# Patient Record
Sex: Male | Born: 1985 | Race: Black or African American | Hispanic: No | Marital: Married | Smoking: Current every day smoker
Health system: Southern US, Community
[De-identification: ages and names within clinical notes are randomized; demographics above are authoritative.]

## PROBLEM LIST (undated history)

## (undated) HISTORY — PX: OTHER SURGICAL HISTORY: SHX169

---
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
## 2007-04-14 ENCOUNTER — Emergency Department (HOSPITAL_COMMUNITY): Admission: EM | Admit: 2007-04-14 | Discharge: 2007-04-14 | Payer: Self-pay | Admitting: Emergency Medicine

## 2008-02-09 ENCOUNTER — Emergency Department (HOSPITAL_COMMUNITY): Admission: EM | Admit: 2008-02-09 | Discharge: 2008-02-09 | Payer: Self-pay | Admitting: Emergency Medicine

## 2008-02-12 ENCOUNTER — Emergency Department (HOSPITAL_COMMUNITY): Admission: EM | Admit: 2008-02-12 | Discharge: 2008-02-12 | Payer: Self-pay | Admitting: Emergency Medicine

## 2009-07-04 ENCOUNTER — Emergency Department (HOSPITAL_COMMUNITY): Admission: EM | Admit: 2009-07-04 | Discharge: 2009-07-04 | Payer: Self-pay | Admitting: Emergency Medicine

## 2009-07-08 ENCOUNTER — Ambulatory Visit (HOSPITAL_COMMUNITY): Admission: RE | Admit: 2009-07-08 | Discharge: 2009-07-08 | Payer: Self-pay | Admitting: Otolaryngology

## 2009-07-10 ENCOUNTER — Emergency Department (HOSPITAL_COMMUNITY): Admission: EM | Admit: 2009-07-10 | Discharge: 2009-07-10 | Payer: Self-pay | Admitting: Emergency Medicine

## 2009-08-04 ENCOUNTER — Emergency Department (HOSPITAL_COMMUNITY): Admission: EM | Admit: 2009-08-04 | Discharge: 2009-08-04 | Payer: Self-pay | Admitting: Emergency Medicine

## 2009-10-04 ENCOUNTER — Emergency Department (HOSPITAL_COMMUNITY): Admission: EM | Admit: 2009-10-04 | Discharge: 2009-10-04 | Payer: Self-pay | Admitting: Family Medicine

## 2010-12-05 LAB — GC/CHLAMYDIA PROBE AMP, GENITAL: GC Probe Amp, Genital: NEGATIVE

## 2010-12-22 LAB — CARBOXYHEMOGLOBIN: Methemoglobin: 1 % (ref 0.0–1.5)

## 2010-12-23 LAB — CBC
MCHC: 34.5 g/dL (ref 30.0–36.0)
MCV: 96.8 fL (ref 78.0–100.0)
Platelets: 257 10*3/uL (ref 150–400)
RBC: 4.1 MIL/uL — ABNORMAL LOW (ref 4.22–5.81)
WBC: 9.9 10*3/uL (ref 4.0–10.5)

## 2011-02-01 NOTE — H&P (Signed)
NAME:  Dustin Juarez, Dustin Juarez               ACCOUNT NO.:  [id number]   MEDICAL RECORD NO.:  [id number]          PATIENT TYPE:  EMS   LOCATION:                               FACILITY:  [facility]   PHYSICIAN:  Michaelyn Barter, M.D. DATE OF BIRTH:  20-Dec-1985   DATE OF ADMISSION:  02/12/2008  DATE OF DISCHARGE:                              HISTORY & PHYSICAL   PRIMARY CARE PHYSICIAN:  Unassigned.   CHIEF COMPLAINTS:  Drug overdose.   HISTORY OF PRESENT ILLNESS:  Mr. Dustin Juarez is a 25 year old gentleman who  indicates that he took at least 25 Tylenol at approximately 11 o'clock  a.m.  He stated that he was feeling very depressed and stressed out and  indicated that he took the Tylenol because he wanted to try to take  things off of his mind. He then went on to state that he does not want  to go into details regarding his current issues and was very vague with  regards to giving any additional history regarding the drug overdose.  He stated that he did not necessarily want to kill himself; however, he  just wanted to clear his mind of his current situation.  He denies  currently having any nausea, vomiting or abdominal pain.  He has no  current complaints and actually indicates that he feels back to  baseline.   PAST MEDICAL HISTORY:  Significant for no illnesses.   PAST SURGICAL HISTORY:  He denies any surgeries.   ALLERGIES:  None.   HOME MEDICATIONS:  None.   SOCIAL HISTORY:  Cigarettes:  The patient started smoking at the age of  71.  He smokes just under 1 pack per day.  Alcohol:  He drinks  occasionally.  Cocaine:  Never. Marijuana:  Occasionally.   FAMILY HISTORY:  Mother lives in [place], and he is not really sure of  her medical history.  Father died from cirrhosis of the liver secondary  to history of alcohol abuse.   REVIEW OF SYSTEMS:  As per HPI.   PHYSICAL EXAMINATION:  GENERAL:  The patient is awake.  He is  cooperative with regards to the physical exam. He is in no obvious  respiratory distress.  VITAL SIGNS:  His blood pressure is 122/83, heart rate 58, respirations  15.  O2 saturation 98% on room air.  HEENT:  Normocephalic, atraumatic.  Anicteric. Extraocular movements are  intact.  Pupils react equally to light bilaterally.  Oral mucosa is  pink.  No thrush, no exudates.  NECK:  Supple.  Shotty anterior and posterior cervical lymphadenopathy  can be appreciated bilaterally, more so on the right than on the left.  No thyromegaly.  CARDIAC:  S1-S2 present.  Regular rate and rhythm.  No murmurs, no  gallops, no rubs.  RESPIRATORY:  No crackles or wheezes.  ABDOMEN:  Flat, soft, nontender, nondistended.  No masses palpated.  No  hepatosplenomegaly appreciated.  EXTREMITIES:  No distal leg edema.  NEUROLOGIC:  The patient is alert and oriented to person, place and  time.  MUSCULOSKELETAL:  5/5 upper and lower extremity strength.   LABORATORY DATA:  Urinalysis is negative. Drug screen was positive for  opiates and for tetrahydrocannabinol. Sodium is 138, potassium 3.9,  chloride 107, CO2 26, glucose 102, BUN 7, creatinine 0.9, bilirubin  total 1.0, alkaline phosphatase 56, SGOT 23, SGPT 16, total protein 6.7,  albumin 3.9, calcium 9.3.  Acetaminophen level is 14.3. WBC 6.9,  hemoglobin 14.3, hematocrit 41, platelet count 261   ASSESSMENT AND PLAN:  1. Intentional drug overdose.  The patient denies wanting to kill      myself; however, he indicates that he was very depressed and      stressed out and did state that he was trying to clear his mind. He      is very vague with regards to the details of what is going on to      prompt his current desire to commit a drug overdose. We will      provide a one-to-one sitter. We will consult psychiatry.  I have      had a very lengthy discussion with the patient.  The patient      actually indicates that he does not want to stay in the hospital      for 24 hours if that is the length of time that it may take for  him      to be interviewed by one of the psychiatrist. He began to remove      his telemetry monitoring and stated that he would like to be seen      by someone sooner as opposed to later, stating that he refuses to      be admitted into the hospital.  I have contacted the [facility] staff, and      they have indicated that they will try to see the patient later      this afternoon. If, however, they are unable to see the patient,      then I will go ahead and admit the patient  2. Tylenol overdose.  Will start the patient on N-acetyl cysteine      treatment.  3. Gastrointestinal prophylaxis.  Protonix.  4. Depression.     Addendum:  An [facility] staff member did evaluate the patient, and she  discharged him home.      Michaelyn Barter, M.D.  Electronically Signed     OR/MEDQ  D:  02/12/2008  T:  02/12/2008  Job:  [id number]

## 2011-06-15 LAB — CBC
MCHC: 35
Platelets: 261
RBC: 4.41

## 2011-06-15 LAB — URINALYSIS, ROUTINE W REFLEX MICROSCOPIC
Hgb urine dipstick: NEGATIVE
Ketones, ur: NEGATIVE
Nitrite: NEGATIVE
Urobilinogen, UA: 1
pH: 7

## 2011-06-15 LAB — RAPID URINE DRUG SCREEN, HOSP PERFORMED
Barbiturates: NOT DETECTED
Benzodiazepines: NOT DETECTED
Cocaine: NOT DETECTED
Opiates: POSITIVE — AB
Tetrahydrocannabinol: POSITIVE — AB

## 2011-06-15 LAB — URINE MICROSCOPIC-ADD ON

## 2011-06-15 LAB — DIFFERENTIAL
Basophils Absolute: 0
Lymphs Abs: 1.6
Monocytes Relative: 7
Neutro Abs: 4.7

## 2011-06-15 LAB — COMPREHENSIVE METABOLIC PANEL
Albumin: 3.9
BUN: 7
CO2: 26
Glucose, Bld: 102 — ABNORMAL HIGH
Total Protein: 6.7

## 2011-06-15 LAB — ACETAMINOPHEN LEVEL: Acetaminophen (Tylenol), Serum: <10 — ABNORMAL LOW

## 2011-07-04 LAB — RAPID URINE DRUG SCREEN, HOSP PERFORMED
Amphetamines: NOT DETECTED
Barbiturates: NOT DETECTED
Benzodiazepines: NOT DETECTED
Cocaine: NOT DETECTED
Opiates: NOT DETECTED
Tetrahydrocannabinol: POSITIVE — AB

## 2011-07-04 LAB — URINALYSIS, ROUTINE W REFLEX MICROSCOPIC
Bilirubin Urine: NEGATIVE
Glucose, UA: NEGATIVE
Hgb urine dipstick: NEGATIVE
Ketones, ur: 15 — AB
Nitrite: NEGATIVE
Protein, ur: NEGATIVE
Specific Gravity, Urine: 1.011
Urobilinogen, UA: 0.2
pH: 7.5

## 2011-07-04 LAB — CBC
HCT: 43.5
Hemoglobin: 14.8
MCHC: 34
MCV: 95.6
Platelets: 297
RBC: 4.55
RDW: 12
WBC: 8.9

## 2011-07-04 LAB — COMPREHENSIVE METABOLIC PANEL
Alkaline Phosphatase: 57
CO2: 19
Chloride: 110
Glucose, Bld: 84
Potassium: 3.6

## 2011-07-04 LAB — DIFFERENTIAL: Monocytes Absolute: 0.3

## 2011-07-04 LAB — AMYLASE: Amylase: 57

## 2011-07-04 LAB — LIPASE, BLOOD: Lipase: 13

## 2014-03-09 ENCOUNTER — Emergency Department (HOSPITAL_COMMUNITY): Payer: Self-pay

## 2014-03-09 ENCOUNTER — Emergency Department (HOSPITAL_COMMUNITY)
Admission: EM | Admit: 2014-03-09 | Discharge: 2014-03-09 | Disposition: A | Discharge status: Home / Self Care | Payer: Self-pay | Attending: Emergency Medicine | Admitting: Emergency Medicine

## 2014-03-09 ENCOUNTER — Encounter (HOSPITAL_COMMUNITY): Payer: Self-pay | Admitting: Emergency Medicine

## 2014-03-09 DIAGNOSIS — R091 Pleurisy: Secondary | ICD-10-CM | POA: Insufficient documentation

## 2014-03-09 DIAGNOSIS — J40 Bronchitis, not specified as acute or chronic: Secondary | ICD-10-CM | POA: Insufficient documentation

## 2014-03-09 DIAGNOSIS — J9801 Acute bronchospasm: Secondary | ICD-10-CM | POA: Insufficient documentation

## 2014-03-09 DIAGNOSIS — F17200 Nicotine dependence, unspecified, uncomplicated: Secondary | ICD-10-CM | POA: Insufficient documentation

## 2014-03-09 DIAGNOSIS — J209 Acute bronchitis, unspecified: Secondary | ICD-10-CM

## 2014-03-09 LAB — CBC WITH DIFFERENTIAL/PLATELET
Basophils Absolute: 0 10*3/uL (ref 0.0–0.1)
Basophils Relative: 0 % (ref 0–1)
EOS ABS: 0.1 10*3/uL (ref 0.0–0.7)
EOS PCT: 1 % (ref 0–5)
HCT: 42.4 % (ref 39.0–52.0)
HEMOGLOBIN: 14.7 g/dL (ref 13.0–17.0)
Lymphocytes Relative: 27 % (ref 12–46)
Lymphs Abs: 2.5 10*3/uL (ref 0.7–4.0)
MCH: 33.1 pg (ref 26.0–34.0)
MCHC: 34.7 g/dL (ref 30.0–36.0)
MCV: 95.5 fL (ref 78.0–100.0)
MONO ABS: 0.5 10*3/uL (ref 0.1–1.0)
Monocytes Relative: 5 % (ref 3–12)
Neutro Abs: 6.1 10*3/uL (ref 1.7–7.7)
Neutrophils Relative %: 67 % (ref 43–77)
Platelets: 278 10*3/uL (ref 150–400)
RBC: 4.44 MIL/uL (ref 4.22–5.81)
RDW: 12.4 % (ref 11.5–15.5)
WBC: 9.1 10*3/uL (ref 4.0–10.5)

## 2014-03-09 LAB — COMPREHENSIVE METABOLIC PANEL
ALT: 14 U/L (ref 0–53)
AST: 20 U/L (ref 0–37)
Albumin: 4.1 g/dL (ref 3.5–5.2)
Alkaline Phosphatase: 42 U/L (ref 39–117)
BUN: 7 mg/dL (ref 6–23)
CO2: 22 meq/L (ref 19–32)
Calcium: 9.6 mg/dL (ref 8.4–10.5)
Chloride: 107 mEq/L (ref 96–112)
Creatinine, Ser: 0.84 mg/dL (ref 0.50–1.35)
GFR calc non Af Amer: >90 mL/min (ref >90)
Glucose, Bld: 82 mg/dL (ref 70–99)
POTASSIUM: 4 meq/L (ref 3.7–5.3)
SODIUM: 143 meq/L (ref 137–147)
TOTAL PROTEIN: 7.4 g/dL (ref 6.0–8.3)
Total Bilirubin: 1 mg/dL (ref 0.3–1.2)

## 2014-03-09 LAB — I-STAT TROPONIN, ED: Troponin i, poc: 0.01 ng/mL (ref 0.00–0.08)

## 2014-03-09 LAB — D-DIMER, QUANTITATIVE: D-Dimer, Quant: <0.27 ug/mL-FEU (ref 0.00–0.48)

## 2014-03-09 MED ORDER — ALBUTEROL SULFATE (2.5 MG/3ML) 0.083% IN NEBU
5.0000 mg | INHALATION_SOLUTION | Freq: Once | RESPIRATORY_TRACT | Status: AC
  Administered 2014-03-09: 5 mg via RESPIRATORY_TRACT
  Filled 2014-03-09: qty 6

## 2014-03-09 MED ORDER — PREDNISONE 20 MG PO TABS
60.0000 mg | ORAL_TABLET | Freq: Once | ORAL | Status: AC
  Administered 2014-03-09: 60 mg via ORAL
  Filled 2014-03-09: qty 3

## 2014-03-09 MED ORDER — ALBUTEROL SULFATE HFA 108 (90 BASE) MCG/ACT IN AERS
2.0000 | INHALATION_SPRAY | RESPIRATORY_TRACT | Status: DC | PRN
  Administered 2014-03-09: 2 via RESPIRATORY_TRACT
  Filled 2014-03-09 (×2): qty 6.7

## 2014-03-09 MED ORDER — ALBUTEROL SULFATE HFA 108 (90 BASE) MCG/ACT IN AERS: 2.0000 | INHALATION_SPRAY | Freq: Once | RESPIRATORY_TRACT | Status: DC

## 2014-03-09 NOTE — ED Notes (Signed)
PT lost inhaler between room and discharge. [facility] -PA stated he could have a 2nd inhaler.

## 2014-03-09 NOTE — ED Notes (Signed)
PT states pain 10/10 sharp stabbing w/ inspiration on L side of lung and pressure on R lung w/ inspiration. PT denies fever, cough, congestion, excessive exercise, etc.

## 2014-03-09 NOTE — ED Notes (Signed)
Pt presents to department for evaluation of chest tightness and SOB. Onset this morning. Tightness increases with deep breathing. Respirations unlabored. Pt is alert and oriented x4.

## 2014-03-09 NOTE — Discharge Instructions (Signed)
Acute Bronchitis Bronchitis is when the airways that extend from the windpipe into the lungs get red, puffy, and painful (inflamed). Bronchitis often causes thick spit (mucus) to develop. This leads to a cough. A cough is the most common symptom of bronchitis. In acute bronchitis, the condition usually begins suddenly and goes away over time (usually in 2 weeks). Smoking, allergies, and asthma can make bronchitis worse. Repeated episodes of bronchitis may cause more lung problems. HOME CARE  Rest.  Drink enough fluids to keep your pee (urine) clear or pale yellow (unless you need to limit fluids as told by your doctor).  Only take over-the-counter or prescription medicines as told by your doctor.  Avoid smoking and secondhand smoke. These can make bronchitis worse. If you are a smoker, think about using nicotine gum or skin patches. Quitting smoking will help your lungs heal faster.  Reduce the chance of getting bronchitis again by:  Washing your hands often.  Avoiding people with cold symptoms.  Trying not to touch your hands to your mouth, nose, or eyes.  Follow up with your doctor as told. GET HELP IF: Your symptoms do not improve after 1 week of treatment. Symptoms include:  Cough.  Fever.  Coughing up thick spit.  Body aches.  Chest congestion.  Chills.  Shortness of breath.  Sore throat. GET HELP RIGHT AWAY IF:   You have an increased fever.  You have chills.  You have severe shortness of breath.  You have bloody thick spit (sputum).  You throw up (vomit) often.  You lose too much body fluid (dehydration).  You have a severe headache.  You faint. MAKE SURE YOU:   Understand these instructions.  Will watch your condition.  Will get help right away if you are not doing well or get worse. Document Released: 02/22/2008 Document Revised: 05/08/2013 Document Reviewed: 02/26/2013 Exit[employer] Patient Information 2015 [employer]. This information is not  intended to replace advice given to you by your health care provider. Make sure you discuss any questions you have with your health care provider.  

## 2014-03-09 NOTE — ED Notes (Signed)
MD at bedside. 

## 2014-03-09 NOTE — ED Provider Notes (Signed)
CSN: [id number]     Arrival date & time 03/09/14  1423 History   First MD Initiated Contact with Patient 03/09/14 1538     Chief Complaint  Patient presents with  . Chest Pain  . Shortness of Breath     (Consider location/radiation/quality/duration/timing/severity/associated sxs/prior Treatment) HPI 28 year old male comes in today complaining of bilateral lower lateral chest pain with deep breathing. He states that it is painful but has not really correlate with shortness of breath. He has not noted any cough, fever, or chills. He is a smoker.  History reviewed. No pertinent past medical history. History reviewed. No pertinent past surgical history. No family history on file. History  Substance Use Topics  . Smoking status: Current Every Day Smoker  . Smokeless tobacco: Not on file  . Alcohol Use: Yes    Review of Systems  All other systems reviewed and are negative.     Allergies  Review of patient's allergies indicates no known allergies.  Home Medications   Prior to Admission medications   Not on File   BP 129/89  Pulse 50  Temp(Src) 98 F (36.7 C) (Oral)  Resp 19  Ht 6\' 2"  (1.88 m)  Wt 177 lb (80.287 kg)  BMI 22.72 kg/m2  SpO2 100% Physical Exam  Nursing note and vitals reviewed. Constitutional: He is oriented to person, place, and time. He appears well-developed and well-nourished.  HENT:  Head: Normocephalic and atraumatic.  Right Ear: External ear normal.  Left Ear: External ear normal.  Nose: Nose normal.  Mouth/Throat: Oropharynx is clear and moist.  Eyes: Conjunctivae and EOM are normal. Pupils are equal, round, and reactive to light.  Neck: Normal range of motion. Neck supple.  Cardiovascular: Normal rate, regular rhythm, normal heart sounds and intact distal pulses.   Pulmonary/Chest: Effort normal. No respiratory distress. He has no wheezes. He exhibits no tenderness.  No wheezing or rales the breath sounds are to use sleep decreased   Abdominal: Soft. Bowel sounds are normal. He exhibits no distension and no mass. There is no tenderness. There is no guarding.  Musculoskeletal: Normal range of motion.  Neurological: He is alert and oriented to person, place, and time. He has normal reflexes. He exhibits normal muscle tone. Coordination normal.  Skin: Skin is warm and dry.  Psychiatric: He has a normal mood and affect. His behavior is normal. Judgment and thought content normal.    ED Course  Procedures (including critical care time) Labs Review Labs Reviewed  CBC WITH DIFFERENTIAL  COMPREHENSIVE METABOLIC PANEL  D-DIMER, QUANTITATIVE  I-STAT TROPOININ, ED    Imaging Review Dg Chest 2 View  03/09/2014   CLINICAL DATA:  CHEST PAIN SHORTNESS OF BREATH  EXAM: CHEST  2 VIEW  COMPARISON:  Two-view chest 08/04/2009  FINDINGS: The heart size and mediastinal contours are within normal limits. Both lungs are clear. The visualized skeletal structures are unremarkable.  IMPRESSION: No active cardiopulmonary disease.   Electronically Signed   By: Salome HolmesHector  Cooper M.D.   On: 03/09/2014 14:59     EKG Interpretation   Date/Time:  Sunday March 09 2014 14:29:45 EDT Ventricular Rate:  53 PR Interval:  194 QRS Duration: 90 QT Interval:  376 QTC Calculation: 352 R Axis:   87 Text Interpretation:  Sinus bradycardia Otherwise normal ECG Confirmed by  Wendal Wilkie MD, Duwayne HeckANIELLE ([id number]) on 03/09/2014 6:05:03 PM      MDM   Final diagnoses:  Bronchitis with bronchospasm  Pleurisy    Patient with symptoms relieved  with nebulizer and with increased air movement.     Hilario Quarryanielle S Greer Koeppen, MD 03/14/14 1026

## 2014-07-17 ENCOUNTER — Emergency Department (HOSPITAL_COMMUNITY)
Admission: EM | Admit: 2014-07-17 | Discharge: 2014-07-17 | Discharge status: Against Medical Advice | Payer: Self-pay | Attending: Emergency Medicine | Admitting: Emergency Medicine

## 2014-07-17 ENCOUNTER — Encounter (HOSPITAL_COMMUNITY): Payer: Self-pay | Admitting: Emergency Medicine

## 2014-07-17 DIAGNOSIS — Z720 Tobacco use: Secondary | ICD-10-CM | POA: Insufficient documentation

## 2014-07-17 DIAGNOSIS — Z5329 Procedure and treatment not carried out because of patient's decision for other reasons: Secondary | ICD-10-CM | POA: Insufficient documentation

## 2014-07-17 NOTE — ED Notes (Signed)
Patient states that he was seen at an Urgent care [address] last week and they filled out a paper for him.   He further states that he didn't have to pay or been seen by a physician, that they "just filled out the paper, you can see I'm fine".   Patient states lost the paper and went back to same urgent care this morning and urgent care advised that it would be $30.00 for a visit and he would have to been seen this time.   Patient advised he didn't have money to be seen and decided to come here "because I don't have to pay y'all".

## 2014-07-17 NOTE — ED Notes (Signed)
Patient states has been out of work since last week for nausea.   Patient states that he had went to urgent care and had the same paper filled out by them, but then lost the paper.  Patient states doesn't have funds to go back to urgent care and get filled out again.   Patient states he has to have filled out before he can return to work.   Patient denies any problems right now.   States he is fine to go back to work.

## 2014-07-17 NOTE — ED Notes (Signed)
Patient states he didn't want to stay "all this time for a physical.   I mean, I ain't gone waste no more time here".  Patient left without signing.

## 2014-07-17 NOTE — ED Notes (Signed)
Pt sts he needs to be cleared and have from filled out for work.

## 2014-07-22 NOTE — ED Provider Notes (Signed)
Patient left before provider evaluation. Please refer to nursing notes for further information.  Arthor CaptainAbigail Beza Steppe, PA-C 07/22/14 1803  Arthor CaptainAbigail Alexei Ey, PA-C 07/22/14 1803  Flint MelterElliott L Wentz, MD 10/06/14 [phone]

## 2014-09-01 ENCOUNTER — Emergency Department (HOSPITAL_COMMUNITY)
Admission: EM | Admit: 2014-09-01 | Discharge: 2014-09-01 | Disposition: A | Discharge status: Home / Self Care | Payer: Self-pay | Attending: Emergency Medicine | Admitting: Emergency Medicine

## 2014-09-01 ENCOUNTER — Encounter (HOSPITAL_COMMUNITY): Payer: Self-pay | Admitting: Emergency Medicine

## 2014-09-01 DIAGNOSIS — Z720 Tobacco use: Secondary | ICD-10-CM | POA: Insufficient documentation

## 2014-09-01 DIAGNOSIS — J0100 Acute maxillary sinusitis, unspecified: Secondary | ICD-10-CM | POA: Insufficient documentation

## 2014-09-01 MED ORDER — IBUPROFEN 800 MG PO TABS: 800.0000 mg | ORAL_TABLET | Freq: Three times a day (TID) | ORAL | Status: AC

## 2014-09-01 MED ORDER — AMOXICILLIN 500 MG PO CAPS: 500.0000 mg | ORAL_CAPSULE | Freq: Three times a day (TID) | ORAL | Status: AC

## 2014-09-01 MED ORDER — TRAMADOL HCL 50 MG PO TABS
50.0000 mg | ORAL_TABLET | Freq: Once | ORAL | Status: AC
  Administered 2014-09-01: 50 mg via ORAL
  Filled 2014-09-01: qty 1

## 2014-09-01 MED ORDER — TRAMADOL HCL 50 MG PO TABS: 50.0000 mg | ORAL_TABLET | Freq: Four times a day (QID) | ORAL | Status: AC | PRN

## 2014-09-01 MED ORDER — AMOXICILLIN 500 MG PO CAPS: 1000.0000 mg | ORAL_CAPSULE | Freq: Two times a day (BID) | ORAL | Status: AC

## 2014-09-01 NOTE — ED Notes (Signed)
Patient c/o R sided face pain x2 days, pain 10/10, sharp, throbbing, c/o R eye blurriness.

## 2014-09-01 NOTE — ED Provider Notes (Signed)
CSN: [id number]     Arrival date & time 09/01/14  [id number] History  This chart was scribed for Dustin GladHeather Laurajean Hosek, PA-C, working with Audree CamelScott T Goldston, MD found by Elon SpannerGarrett Cook, ED Scribe. This patient was seen in room WTR5/WTR5 and the patient's care was started at 7:42 PM.   No chief complaint on file.  The history is provided by the patient. No language interpreter was used.   HPI Comments: Dustin Rampimothy Friedland is a 28 y.o. male who presents to the Emergency Department complaining of constant dental pain concentrated on the roof of his mouth onset 2 days ago.  Patient states that "it feels like an abscess".  The pain is worsened by eating, touch.  He reports the pain radiates to the right side of his face and right side of his head.  He reports it "feels swollen" but does not actually believe it is swollen.  Patient has taken ibuprofen/Tylenol without relief.  He reports difficulty sleeping due to pain.  Patient denies fever, nausea, vomiting, nuchal rigidity, nasal congestion.    No past medical history on file. No past surgical history on file. No family history on file. History  Substance Use Topics  . Smoking status: Current Every Day Smoker  . Smokeless tobacco: Not on file  . Alcohol Use: Yes    Review of Systems  Constitutional: Negative for fever.  HENT: Positive for dental problem. Negative for congestion.   Gastrointestinal: Negative for nausea and vomiting.  Neurological: Positive for headaches.  All other systems reviewed and are negative.     Allergies  Review of patient's allergies indicates no known allergies.  Home Medications   Prior to Admission medications   Not on File   BP 153/82 mmHg  Pulse 71  Temp(Src) 98.5 F (36.9 C) (Oral)  Resp 16  Ht 6\' 2"  (1.88 m)  Wt 185 lb (83.915 kg)  BMI 23.74 kg/m2  SpO2 96% Physical Exam  Constitutional: He is oriented to person, place, and time. He appears well-developed and well-nourished. No distress.  HENT:  Head:  Normocephalic and atraumatic.  Mouth/Throat: Oropharynx is clear and moist. No trismus in the jaw. No dental abscesses.  Visualization of right and left TM partially obscured due to cerumen.  Visualized portion of TM without erythema.  No sublingual tenderness or swelling.  TTP of right maxillary sinus.  No erythema or edema or lesions visualized.    Eyes: Conjunctivae and EOM are normal.  Neck: Neck supple. No tracheal deviation present.  Cardiovascular: Normal rate.   Pulmonary/Chest: Effort normal. No respiratory distress.  Musculoskeletal: Normal range of motion.  Neurological: He is alert and oriented to person, place, and time.  Skin: Skin is warm and dry.  Psychiatric: He has a normal mood and affect. His behavior is normal.  Nursing note and vitals reviewed.   ED Course  Procedures (including critical care time)  DIAGNOSTIC STUDIES: Oxygen Saturation is 96% on RA, adequate by my interpretation.    COORDINATION OF CARE:  7:47 PM Will prescribe antibiotic.  Patient acknowledges and agrees with plan.    Labs Review Labs Reviewed - No data to display  Imaging Review No results found.   EKG Interpretation None      MDM   Final diagnoses:  None   Patient presenting with sinus pain.  Non toxic appearing and afebrile.  Given Rx for antibiotic.  Stable for discharge.  Return precautions given.    I personally performed the services described in this documentation, which  was scribed in my presence. The recorded information has been reviewed and is accurate.    Dustin GladHeather Ennis Heavner, PA-C 09/04/14 2118  Audree CamelScott T Goldston, MD 09/08/14 [phone]

## 2014-09-05 ENCOUNTER — Encounter (HOSPITAL_COMMUNITY): Payer: Self-pay | Admitting: *Deleted

## 2014-09-05 ENCOUNTER — Emergency Department (HOSPITAL_COMMUNITY)
Admission: EM | Admit: 2014-09-05 | Discharge: 2014-09-05 | Disposition: A | Discharge status: Home / Self Care | Payer: Self-pay | Attending: Emergency Medicine | Admitting: Emergency Medicine

## 2014-09-05 DIAGNOSIS — Z720 Tobacco use: Secondary | ICD-10-CM | POA: Insufficient documentation

## 2014-09-05 DIAGNOSIS — R202 Paresthesia of skin: Secondary | ICD-10-CM | POA: Insufficient documentation

## 2014-09-05 DIAGNOSIS — F1210 Cannabis abuse, uncomplicated: Secondary | ICD-10-CM | POA: Insufficient documentation

## 2014-09-05 DIAGNOSIS — M791 Myalgia: Secondary | ICD-10-CM | POA: Insufficient documentation

## 2014-09-05 DIAGNOSIS — R197 Diarrhea, unspecified: Secondary | ICD-10-CM | POA: Insufficient documentation

## 2014-09-05 DIAGNOSIS — E876 Hypokalemia: Secondary | ICD-10-CM | POA: Insufficient documentation

## 2014-09-05 DIAGNOSIS — Z791 Long term (current) use of non-steroidal anti-inflammatories (NSAID): Secondary | ICD-10-CM | POA: Insufficient documentation

## 2014-09-05 DIAGNOSIS — R1110 Vomiting, unspecified: Secondary | ICD-10-CM | POA: Insufficient documentation

## 2014-09-05 DIAGNOSIS — Z792 Long term (current) use of antibiotics: Secondary | ICD-10-CM | POA: Insufficient documentation

## 2014-09-05 LAB — CBC WITH DIFFERENTIAL/PLATELET
Basophils Absolute: 0 10*3/uL (ref 0.0–0.1)
Basophils Relative: 0 % (ref 0–1)
EOS ABS: 0.1 10*3/uL (ref 0.0–0.7)
EOS PCT: 0 % (ref 0–5)
HCT: 44 % (ref 39.0–52.0)
HEMOGLOBIN: 14.9 g/dL (ref 13.0–17.0)
LYMPHS ABS: 0.5 10*3/uL — AB (ref 0.7–4.0)
Lymphocytes Relative: 3 % — ABNORMAL LOW (ref 12–46)
MCH: 32.5 pg (ref 26.0–34.0)
MCHC: 33.9 g/dL (ref 30.0–36.0)
MCV: 95.9 fL (ref 78.0–100.0)
MONO ABS: 0.5 10*3/uL (ref 0.1–1.0)
MONOS PCT: 3 % (ref 3–12)
Neutro Abs: 13.7 10*3/uL — ABNORMAL HIGH (ref 1.7–7.7)
Neutrophils Relative %: 94 % — ABNORMAL HIGH (ref 43–77)
PLATELETS: 281 10*3/uL (ref 150–400)
RBC: 4.59 MIL/uL (ref 4.22–5.81)
RDW: 11.9 % (ref 11.5–15.5)
WBC: 14.7 10*3/uL — ABNORMAL HIGH (ref 4.0–10.5)

## 2014-09-05 LAB — BASIC METABOLIC PANEL
Anion gap: 13 (ref 5–15)
BUN: 8 mg/dL (ref 6–23)
CHLORIDE: 104 meq/L (ref 96–112)
CO2: 20 mEq/L (ref 19–32)
Calcium: 9.2 mg/dL (ref 8.4–10.5)
Creatinine, Ser: 0.93 mg/dL (ref 0.50–1.35)
GFR calc non Af Amer: >90 mL/min (ref >90)
Glucose, Bld: 102 mg/dL — ABNORMAL HIGH (ref 70–99)
Potassium: 3.4 mEq/L — ABNORMAL LOW (ref 3.7–5.3)
Sodium: 137 mEq/L (ref 137–147)

## 2014-09-05 LAB — CK: Total CK: 270 U/L — ABNORMAL HIGH (ref 7–232)

## 2014-09-05 MED ORDER — POTASSIUM CHLORIDE CRYS ER 20 MEQ PO TBCR
40.0000 meq | EXTENDED_RELEASE_TABLET | Freq: Once | ORAL | Status: AC
  Administered 2014-09-05: 40 meq via ORAL
  Filled 2014-09-05: qty 2

## 2014-09-05 MED ORDER — ONDANSETRON 8 MG PO TBDP
8.0000 mg | ORAL_TABLET | Freq: Once | ORAL | Status: AC
  Administered 2014-09-05: 8 mg via ORAL
  Filled 2014-09-05: qty 1

## 2014-09-05 MED ORDER — ONDANSETRON HCL 8 MG PO TABS: 8.0000 mg | ORAL_TABLET | Freq: Three times a day (TID) | ORAL | Status: AC | PRN

## 2014-09-05 NOTE — ED Provider Notes (Signed)
CSN: [id number]     Arrival date & time 09/05/14  1208 History   First MD Initiated Contact with Patient 09/05/14 1312     Chief Complaint  Patient presents with  . body soreness   . hand numbness       Patient is a 28 y.o. male presenting with vomiting. The history is provided by the patient.  Emesis Severity:  Moderate Duration: several hours. Timing:  Intermittent Progression:  Worsening Chronicity:  New Relieved by:  None tried Worsened by:  Nothing tried Associated symptoms: diarrhea and myalgias   Patient reports after waking up this morning he smoked marijuana He went to work and was sent home as he was not needed He reports that soon after he had episodes of nonbloody vomiting and one episode of nonbloody diarrea No cp/sob/abd pain He reports myalgias He also reports numbness in his hands/arms.  No weakness is reported.    PMH - none Soc hx - marijuana abuse Past Surgical History  Procedure Laterality Date  . Plate in left sinus cavity     History reviewed. No pertinent family history. History  Substance Use Topics  . Smoking status: Current Every Day Smoker -- 0.50 packs/day for 10 years    Types: Cigarettes  . Smokeless tobacco: Not on file  . Alcohol Use: Yes    Review of Systems  Constitutional: Negative for fever.  Cardiovascular: Negative for chest pain.  Gastrointestinal: Positive for vomiting and diarrhea.  Musculoskeletal: Positive for myalgias.  Neurological: Positive for numbness. Negative for weakness.  All other systems reviewed and are negative.     Allergies  Review of patient's allergies indicates no known allergies.  Home Medications   Prior to Admission medications   Medication Sig Start Date End Date Taking? Authorizing Provider  amoxicillin (AMOXIL) 500 MG capsule Take 2 capsules (1,000 mg total) by mouth 2 (two) times daily. 09/01/14   Heather Laisure, PA-C  amoxicillin (AMOXIL) 500 MG capsule Take 1 capsule (500 mg total) by  mouth 3 (three) times daily. 09/01/14   Heather Laisure, PA-C  ibuprofen (ADVIL,MOTRIN) 800 MG tablet Take 1 tablet (800 mg total) by mouth 3 (three) times daily. 09/01/14   Heather Laisure, PA-C  ibuprofen (ADVIL,MOTRIN) 800 MG tablet Take 1 tablet (800 mg total) by mouth 3 (three) times daily. 09/01/14   Heather Laisure, PA-C  ondansetron (ZOFRAN) 8 MG tablet Take 1 tablet (8 mg total) by mouth every 8 (eight) hours as needed for nausea. 09/05/14   Joya Gaskinsonald W Adrina Armijo, MD  traMADol (ULTRAM) 50 MG tablet Take 1 tablet (50 mg total) by mouth every 6 (six) hours as needed. 09/01/14   Heather Laisure, PA-C   BP 127/89 mmHg  Pulse 74  Temp(Src) 98.3 F (36.8 C) (Oral)  Resp 16  SpO2 99% Physical Exam CONSTITUTIONAL: Well developed/well nourished HEAD: Normocephalic/atraumatic EYES: EOMI/PERRL ENMT: Mucous membranes moist NECK: supple no meningeal signs SPINE/BACK:entire spine nontender CV: S1/S2 noted, no murmurs/rubs/gallops noted LUNGS: Lungs are clear to auscultation bilaterally, no apparent distress ABDOMEN: soft, nontender, no rebound or guarding, bowel sounds noted throughout abdomen GU:no cva tenderness NEURO: Pt is awake/alert/appropriate, moves all extremitiesx4.  No facial droop.   No arm/leg drift.  Reports numbness to bilateral forearms only but equal hand grips and equal power with 5/5 strength with shoulder abduction/elbow flex/extension and wrist flex/extension.   EXTREMITIES: pulses normal/equal x4 extremities, full ROM SKIN: warm, color normal PSYCH: no abnormalities of mood noted, alert and oriented to situation  ED Course  Procedures   No focal neuro deficits Suspect viral GE given vomiting/diarrhea He is well appearing, no distress, appropriate for d/c home Advised against marijuana abuse Labs Review Labs Reviewed  BASIC METABOLIC PANEL - Abnormal; Notable for the following:    Potassium 3.4 (*)    Glucose, Bld 102 (*)    All other components within normal  limits  CK - Abnormal; Notable for the following:    Total CK 270 (*)    All other components within normal limits  CBC WITH DIFFERENTIAL - Abnormal; Notable for the following:    WBC 14.7 (*)    Neutrophils Relative % 94 (*)    Neutro Abs 13.7 (*)    Lymphocytes Relative 3 (*)    Lymphs Abs 0.5 (*)    All other components within normal limits     Medications  ondansetron (ZOFRAN-ODT) disintegrating tablet 8 mg (8 mg Oral Given 09/05/14 1356)  potassium chloride SA (K-DUR,KLOR-CON) CR tablet 40 mEq (40 mEq Oral Given 09/05/14 1510)    MDM   Final diagnoses:  Vomiting and diarrhea  Marijuana abuse  Paresthesia  Paresthesia of hand, bilateral  Hypokalemia   Nursing notes including past medical history and social history reviewed and considered in documentation Labs/vital reviewed myself and considered during evaluation Previous records reviewed and considered      Joya Gaskinsonald W Delaney Schnick, MD 09/05/14 [phone]

## 2014-09-05 NOTE — ED Notes (Signed)
Pt reports he smoked marijuana at 0600, started having body aches, and hand numbness since 0900. Pt reports he was seen and treated on 12/14, given abx which he has been taking. Pain 10/10.

## 2016-04-11 IMAGING — CR DG CHEST 2V
2 series · 2 of 2 positions shown · non-contrast
Comparison: Two-view chest 08/04/2009

CLINICAL DATA: CHEST PAIN SHORTNESS OF BREATH

EXAM:
CHEST  2 VIEW

[w chest pa]
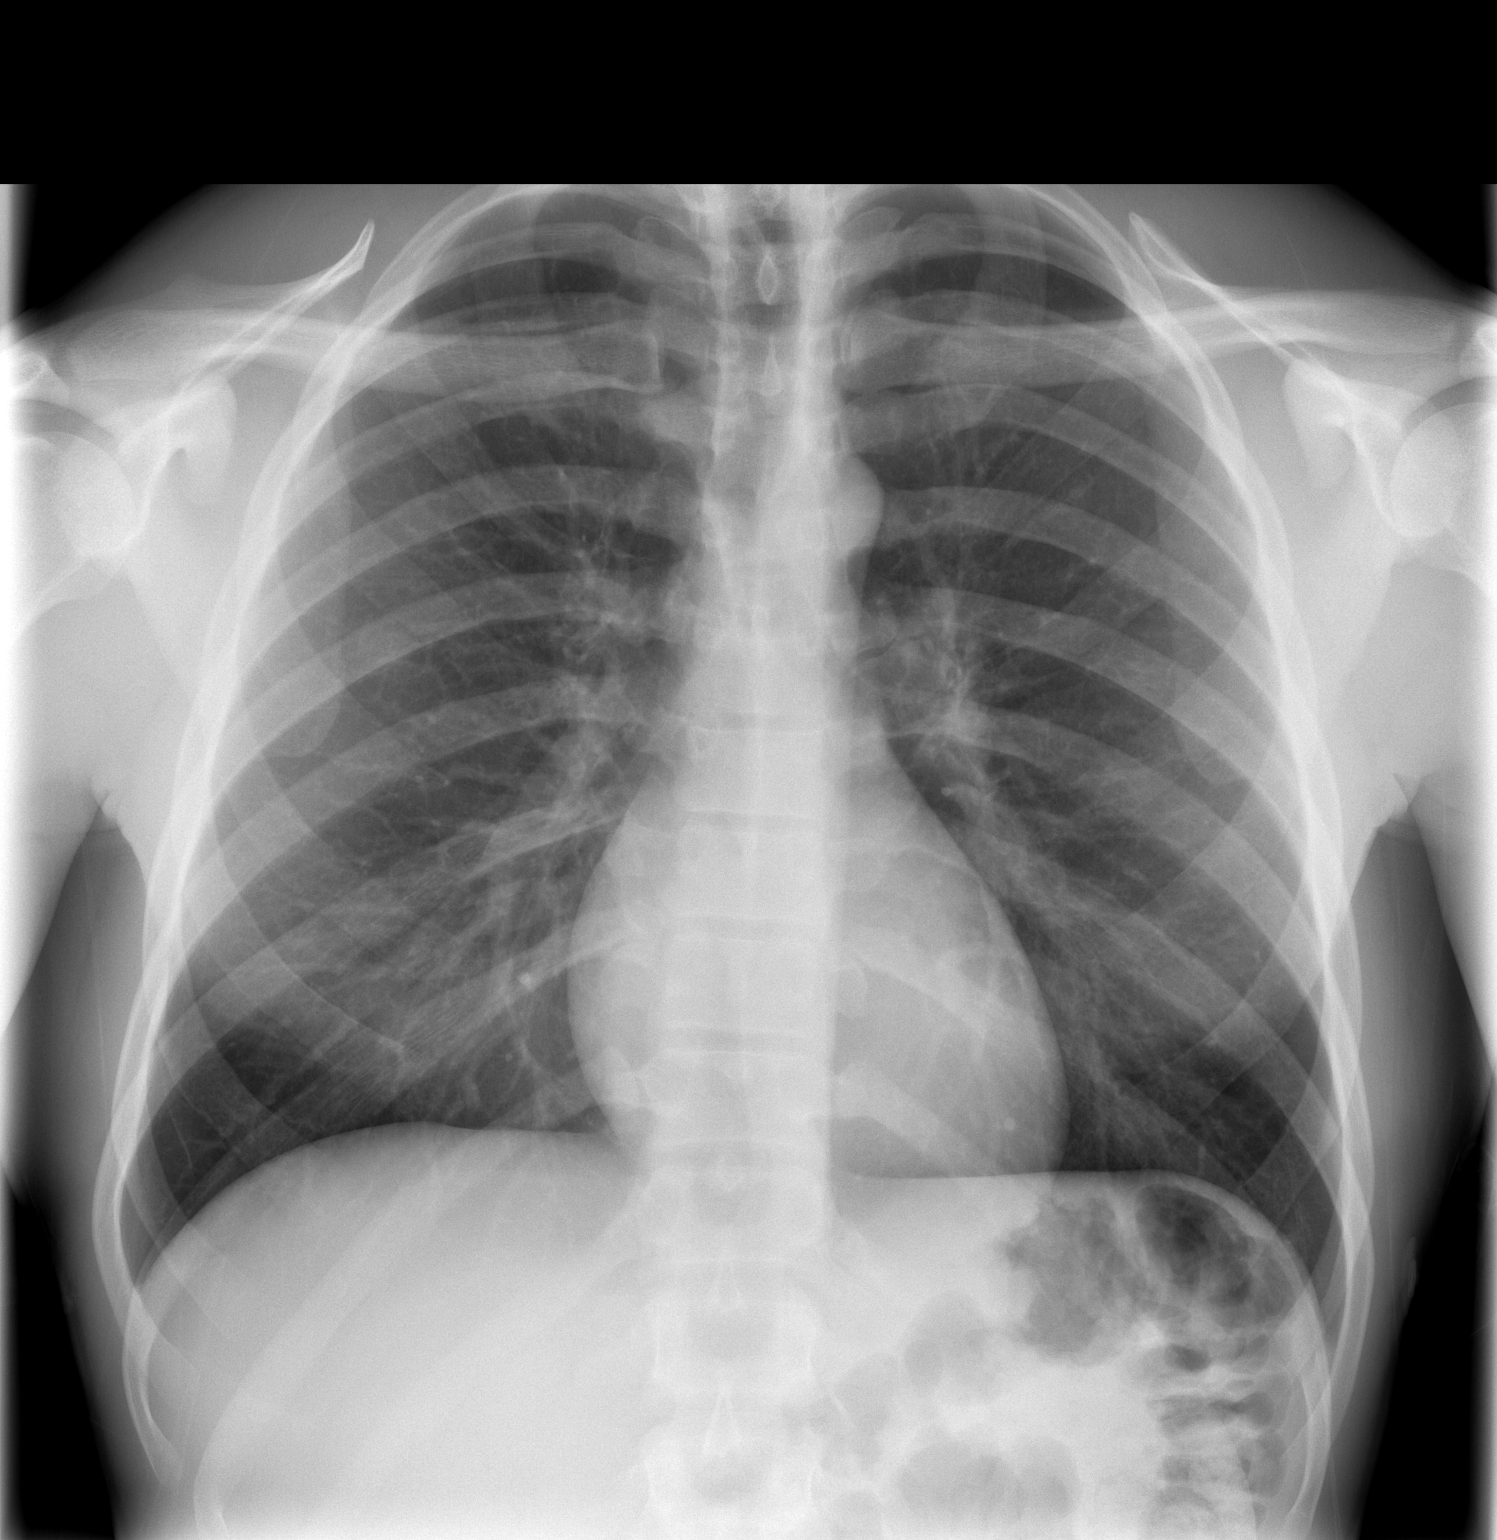

[w chest lat]
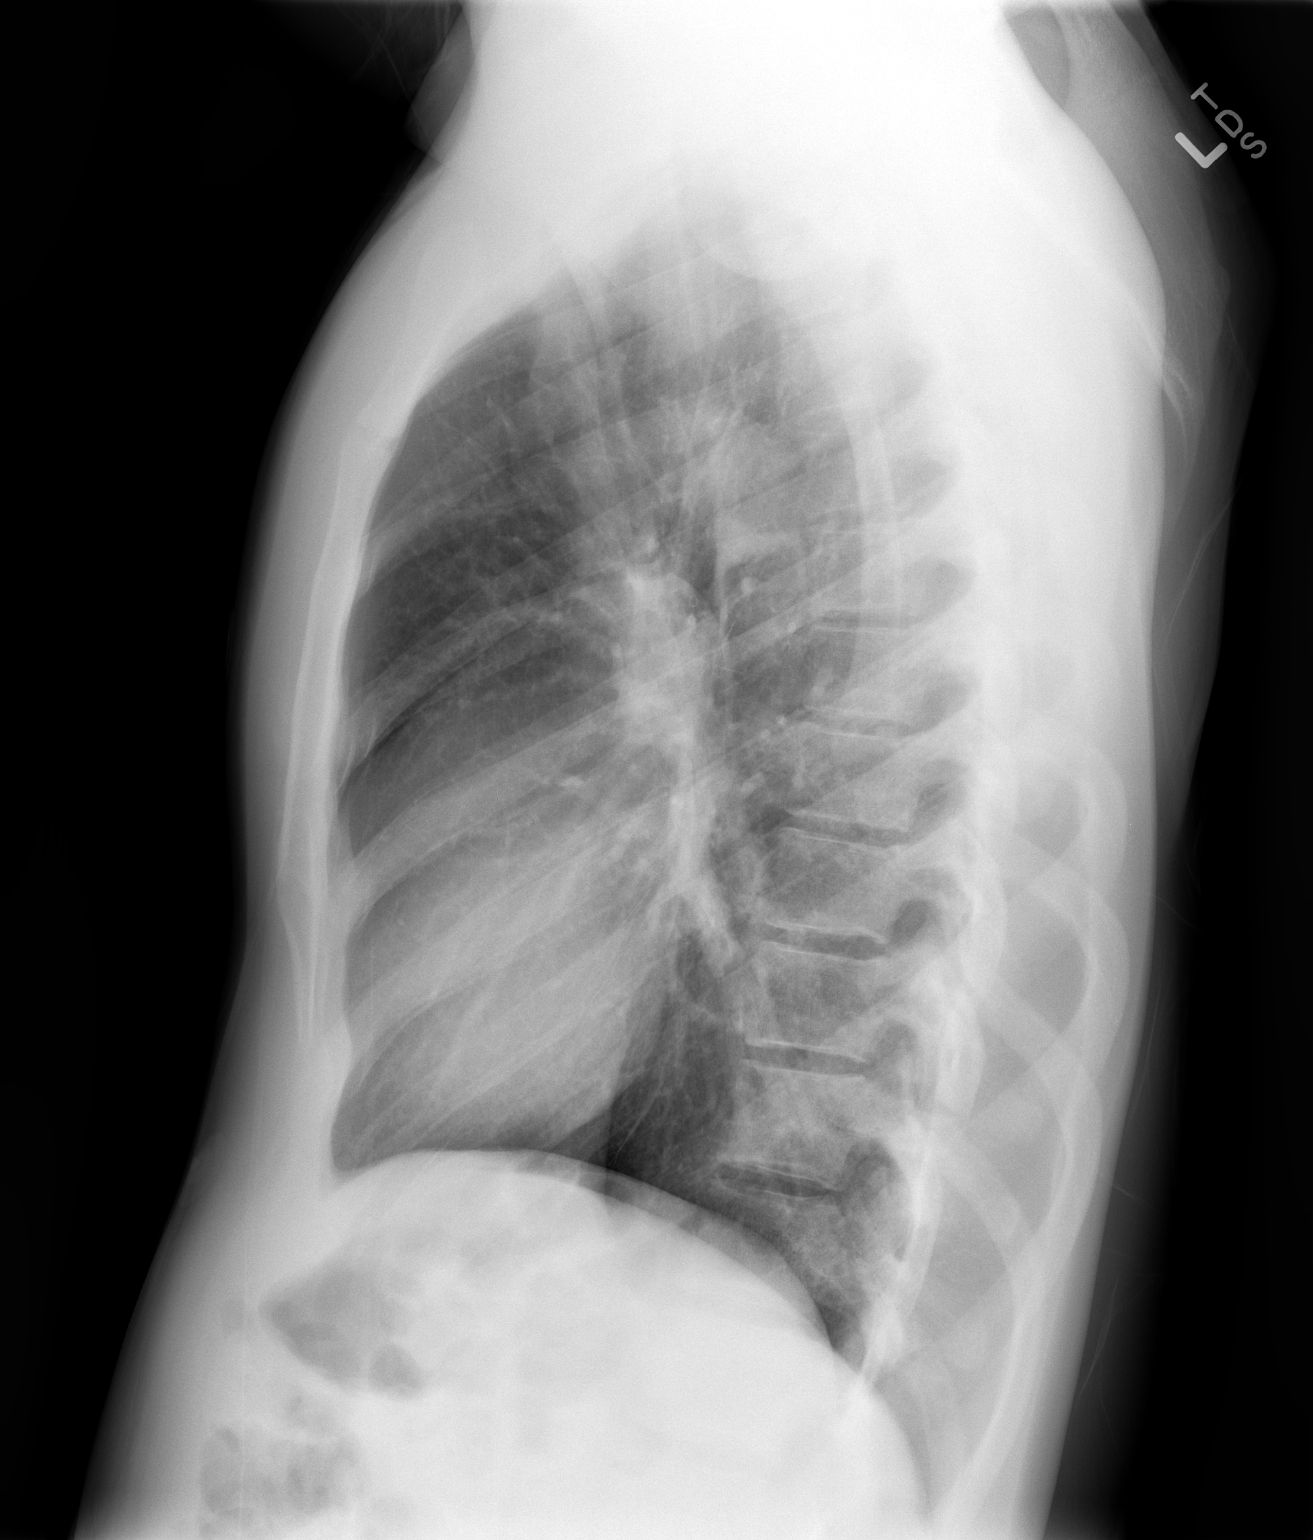

[2 of 2 positions shown; findings below may reference images not displayed]

FINDINGS: The heart size and mediastinal contours are within normal limits.
Both lungs are clear. The visualized skeletal structures are
unremarkable.
IMPRESSION: No active cardiopulmonary disease.
